# Patient Record
Sex: Male | Born: 1980 | Race: White | Hispanic: No | Marital: Single | Smoking: Current every day smoker
Health system: Southern US, Community
[De-identification: ages and names within clinical notes are randomized; demographics above are authoritative.]

## PROBLEM LIST (undated history)

## (undated) DIAGNOSIS — G7100 Muscular dystrophy, unspecified: Secondary | ICD-10-CM

## (undated) HISTORY — PX: ORIF ELBOW FRACTURE: SUR928

---

## 2018-01-02 ENCOUNTER — Emergency Department (HOSPITAL_COMMUNITY): Payer: Medicaid Other

## 2018-01-02 ENCOUNTER — Other Ambulatory Visit: Payer: Self-pay

## 2018-01-02 ENCOUNTER — Emergency Department (HOSPITAL_COMMUNITY)
Admission: EM | Admit: 2018-01-02 | Discharge: 2018-01-02 | Disposition: A | Discharge status: Home / Self Care | Payer: Medicaid Other | Attending: Emergency Medicine | Admitting: Emergency Medicine

## 2018-01-02 ENCOUNTER — Encounter (HOSPITAL_COMMUNITY): Payer: Self-pay

## 2018-01-02 DIAGNOSIS — Y9301 Activity, walking, marching and hiking: Secondary | ICD-10-CM | POA: Insufficient documentation

## 2018-01-02 DIAGNOSIS — S50312A Abrasion of left elbow, initial encounter: Secondary | ICD-10-CM | POA: Diagnosis not present

## 2018-01-02 DIAGNOSIS — S93401A Sprain of unspecified ligament of right ankle, initial encounter: Secondary | ICD-10-CM | POA: Diagnosis not present

## 2018-01-02 DIAGNOSIS — Y999 Unspecified external cause status: Secondary | ICD-10-CM | POA: Diagnosis not present

## 2018-01-02 DIAGNOSIS — F17200 Nicotine dependence, unspecified, uncomplicated: Secondary | ICD-10-CM | POA: Insufficient documentation

## 2018-01-02 DIAGNOSIS — T148XXA Other injury of unspecified body region, initial encounter: Secondary | ICD-10-CM

## 2018-01-02 DIAGNOSIS — S99911A Unspecified injury of right ankle, initial encounter: Secondary | ICD-10-CM | POA: Diagnosis present

## 2018-01-02 DIAGNOSIS — Y929 Unspecified place or not applicable: Secondary | ICD-10-CM | POA: Diagnosis not present

## 2018-01-02 DIAGNOSIS — Z23 Encounter for immunization: Secondary | ICD-10-CM | POA: Diagnosis not present

## 2018-01-02 DIAGNOSIS — W101XXA Fall (on)(from) sidewalk curb, initial encounter: Secondary | ICD-10-CM | POA: Insufficient documentation

## 2018-01-02 HISTORY — DX: Muscular dystrophy, unspecified: G71.00

## 2018-01-02 MED ORDER — ACETAMINOPHEN 325 MG PO TABS
650.0000 mg | ORAL_TABLET | Freq: Once | ORAL | Status: AC
  Administered 2018-01-02: 650 mg via ORAL
  Filled 2018-01-02: qty 2

## 2018-01-02 MED ORDER — BACITRACIN ZINC 500 UNIT/GM EX OINT
TOPICAL_OINTMENT | Freq: Once | CUTANEOUS | Status: AC
  Administered 2018-01-02: 15:00:00 via TOPICAL
  Filled 2018-01-02: qty 0.9

## 2018-01-02 MED ORDER — TETANUS-DIPHTH-ACELL PERTUSSIS 5-2.5-18.5 LF-MCG/0.5 IM SUSP
0.5000 mL | Freq: Once | INTRAMUSCULAR | Status: AC
  Administered 2018-01-02: 0.5 mL via INTRAMUSCULAR
  Filled 2018-01-02: qty 0.5

## 2018-01-02 NOTE — ED Triage Notes (Signed)
[place] tripped and fell [place] [facility] [place]. He c/o right ankle and left elbow pain. He is in no distress. He tells [place] he has hx of "muscular dystrophy".

## 2018-01-02 NOTE — ED Notes (Signed)
Bed: BJ47 Expected date:  Expected time:  Means of arrival:  Comments: 37 yo fall

## 2018-01-02 NOTE — ED Provider Notes (Signed)
[facility]-EMERGENCY DEPT Provider Note   CSN: [id number] Arrival date & time: 01/02/18  1408     History   Chief Complaint Chief Complaint  Patient presents with  . Fall  . Ankle Pain    HPI Chad Ochoa is a 37 y.o. male.  The history is provided by the patient and medical records. No language interpreter was used.  Fall  Pertinent negatives include no abdominal pain.  Ankle Pain   Pertinent negatives include no numbness.   Chad Ochoa is a 37 y.o. male  with a PMH of muscular dystrophy who presents to the Emergency Department for evaluation after mechanical fall just prior to arrival.  Patient states that he tripped on a curb and lost his balance, causing him to roll his right ankle strangely.  He fell on his left hand/elbow as well.  He is complaining of right ankle pain and left wrist pain.  He notes abrasion to the left elbow, but does not have any pain to the site.  Unsure of last tetanus status.  No numbness, tingling or weakness.  He has not taken any medications prior to arrival for his symptoms.   Past Medical History:  Diagnosis Date  . Muscular dystrophy (HCC)     There are no active problems to display for this patient.      Home Medications    Prior to Admission medications   Not on File    Family History No family history on file.  Social History Social History   Tobacco Use  . Smoking status: Current Every Day Smoker  . Smokeless tobacco: Never Used  Substance Use Topics  . Alcohol use: Never    Frequency: Never  . Drug use: Never     Allergies   Patient has no known allergies.   Review of Systems Review of Systems  Gastrointestinal: Negative for abdominal pain, nausea and vomiting.  Musculoskeletal: Positive for arthralgias and myalgias.  Skin: Positive for wound.  Neurological: Negative for weakness and numbness.     Physical Exam Updated Vital Signs BP 117/77 (BP Location: Right Arm)   Pulse (!) 108    Temp 99.7 F (37.6 C) (Oral)   Resp 14   Ht  (1.803 m)   Wt 81.6 kg (180 lb)   SpO2 92%   BMI 25.10 kg/m   Physical Exam  Constitutional: He is oriented to person, place, and time. He appears well-developed and well-nourished. No distress.  HENT:  Head: Normocephalic and atraumatic.  Cardiovascular: Normal rate, regular rhythm and normal heart sounds.  No murmur heard. Pulmonary/Chest: Effort normal and breath sounds normal. No respiratory distress.  Abdominal: Soft. He exhibits no distension. There is no tenderness.  Musculoskeletal:  Superficial abrasion to the left elbow.  No tenderness to the elbow and patient with full range of motion without pain.  He does have tenderness to palpation diffusely of the left wrist.  Full range of motion of the wrist.  No anatomical snuffbox tenderness.  Good grip strength.  Right medial ankle tender to palpation as well as the forefoot near the toes.  He has good range of motion of these.  Sensation intact in all 4 extremities.  Distal pulses intact x4.  Neurological: He is alert and oriented to person, place, and time.  Skin: Skin is warm and dry.  Nursing note and vitals reviewed.    ED Treatments / Results  Labs (all labs ordered are listed, but only abnormal results are displayed)  Labs Reviewed - No data to display  EKG None  Radiology Dg Wrist Complete Left  Result Date: 01/02/2018 CLINICAL DATA:  Fall today. Left wrist injury and pain. Initial encounter. EXAM: LEFT WRIST - COMPLETE 3+ VIEW COMPARISON:  None. FINDINGS: There is no evidence of fracture or dislocation. There is no evidence of arthropathy or other focal bone abnormality. Soft tissues are unremarkable. IMPRESSION: Negative. Electronically Signed   By: Myles Rosenthal M.D.   On: 01/02/2018 15:39   Dg Ankle Complete Right  Result Date: 01/02/2018 CLINICAL DATA:  Fall today. Right ankle pain and swelling. Initial encounter. EXAM: RIGHT ANKLE - COMPLETE 3+ VIEW  COMPARISON:  None. FINDINGS: There is no evidence of fracture, dislocation, or joint effusion. There is no evidence of arthropathy or other focal bone abnormality. Soft tissues are unremarkable. IMPRESSION: Negative. Electronically Signed   By: Myles Rosenthal M.D.   On: 01/02/2018 15:37   Dg Foot 2 Views Right  Result Date: 01/02/2018 CLINICAL DATA:  Fall today.  Right foot pain.  Initial encounter. EXAM: RIGHT FOOT - 2 VIEW COMPARISON:  None. FINDINGS: There is a possible fracture versus degenerative spur along the medial aspect of the PIP joint of the little toe. No other fractures are identified. No evidence of dislocation. IMPRESSION: Question fracture versus degenerative spur along the PIP joint of the little toe. Recommend clinical correlation, and consider dedicated toe radiographs for further evaluation if clinically warranted. Electronically Signed   By: Myles Rosenthal M.D.   On: 01/02/2018 15:34    Procedures Procedures (including critical care time)  Medications Ordered in ED Medications  Tdap (BOOSTRIX) injection 0.5 mL (0.5 mLs Intramuscular Given 01/02/18 1501)  bacitracin ointment ( Topical Given 01/02/18 1452)  acetaminophen (TYLENOL) tablet 650 mg (650 mg Oral Given 01/02/18 1514)     Initial Impression / Assessment and Plan / ED Course  I have reviewed the triage vital signs and the nursing notes.  Pertinent labs & imaging results that were available during my care of the patient were reviewed by me and considered in my medical decision making (see chart for details).    Chad Ochoa is a 37 y.o. male who presents to ED for evaluation after mechanical fall just prior to arrival.  Main complaints are right ankle and foot pain as well as left wrist pain.  Neurovascularly intact.  No anatomical snuffbox tenderness of the wrist.  X-rays obtained.  Negative ankle and wrist.  Foot with questionable fracture versus degenerative spur along the PIP joint of his little toe.  He is not tender in  this area.  Do not feel as if any further treatment to be done regarding this imaging.  Did have superficial abrasions which were cleaned in the emergency department.  Tetanus updated.  Wound care discussed.  Symptomatic home care discussed as well.  PCP follow-up if symptoms persist.  Reasons to return to ER discussed and all questions answered.    Final Clinical Impressions(s) / ED Diagnoses   Final diagnoses:  Sprain of right ankle, unspecified ligament, initial encounter  Abrasion    ED Discharge Orders    None       Ward, Chase Picket, PA-C 01/02/18 1636    Rolland Porter, MD 01/08/18 [phone]

## 2018-01-02 NOTE — Discharge Instructions (Signed)
Tylenol or Ibuprofen as needed for pain. Ice and alleviate for additional pain relief. If your symptoms persist without improvement in one week, please follow up with your primary care docto.    TREATMENT  Rest, ice, elevation, and compression are the basic modes of treatment.    HOME CARE INSTRUCTIONS  Apply ice to the sore area for 15 to 20 minutes, 3 to 4 times per day. Do this while you are awake for the first 2 days. This can be stopped when the swelling goes away. Put the ice in a plastic bag and place a towel between the bag of ice and your skin.  Keep your leg elevated when possible to lessen swelling.  You may take off your ankle stabilizer at night and to take a shower or bath. Wiggle your toes several times per day if you are able.  ACTIVITY:            - Weight bearing as tolerated - if you can do it, do it. If it hurts, stop.             - Exercises should be limited to pain free range of motion            - Can start mobilization by tracing the alphabet with your foot in the air.       SEEK MEDICAL CARE IF:  You have an increase in bruising, swelling, or pain.  Your toes feel cold.  Pain relief is not achieved with medications.  EMERGENCY:: Your toes are numb or blue or you have severe pain.   MAKE SURE YOU:  Understand these instructions.  Will watch your condition.  Will get help right away if you are not doing well or get worse

## 2018-12-15 IMAGING — CR DG WRIST COMPLETE 3+V*L*
4 series · 4 of 4 positions shown · non-contrast
Comparison: None.

CLINICAL DATA: Fall today. Left wrist injury and pain. Initial
encounter.

EXAM:
LEFT WRIST - COMPLETE 3+ VIEW

[x wrist pa left]
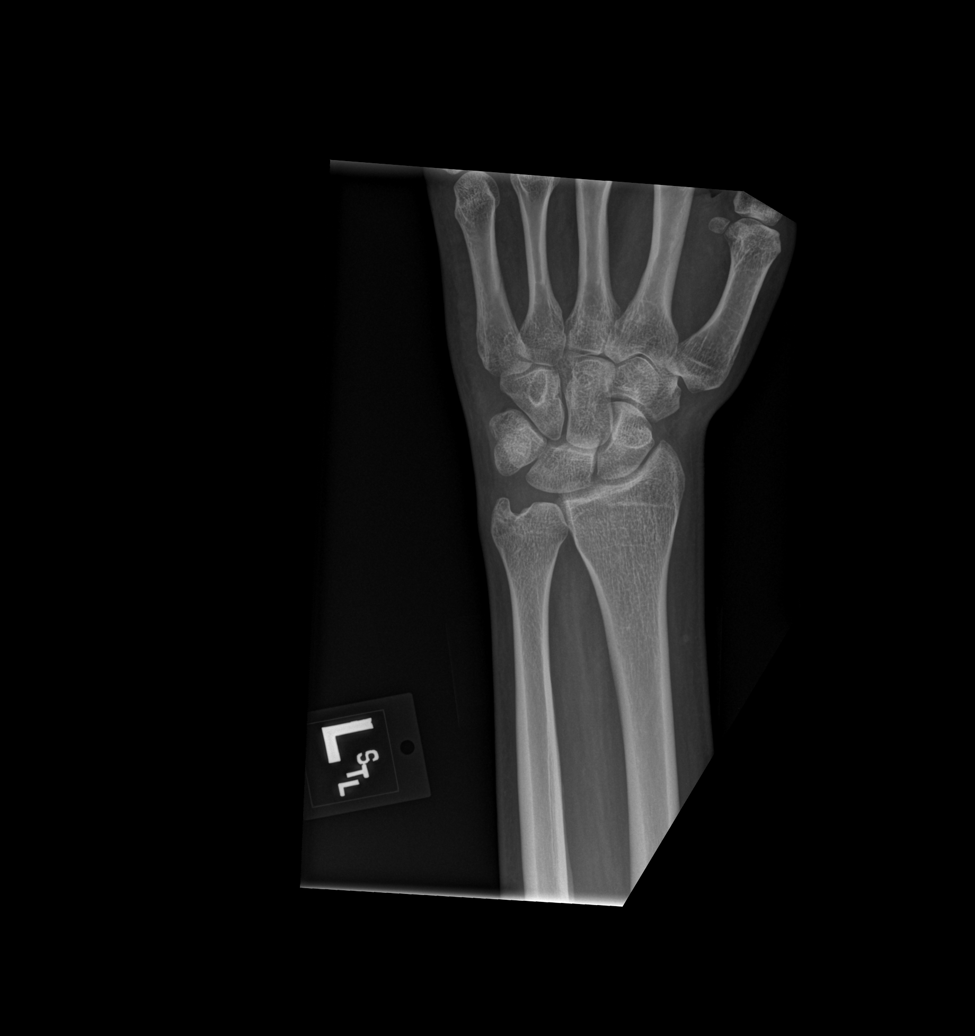

[x wrist obl left]
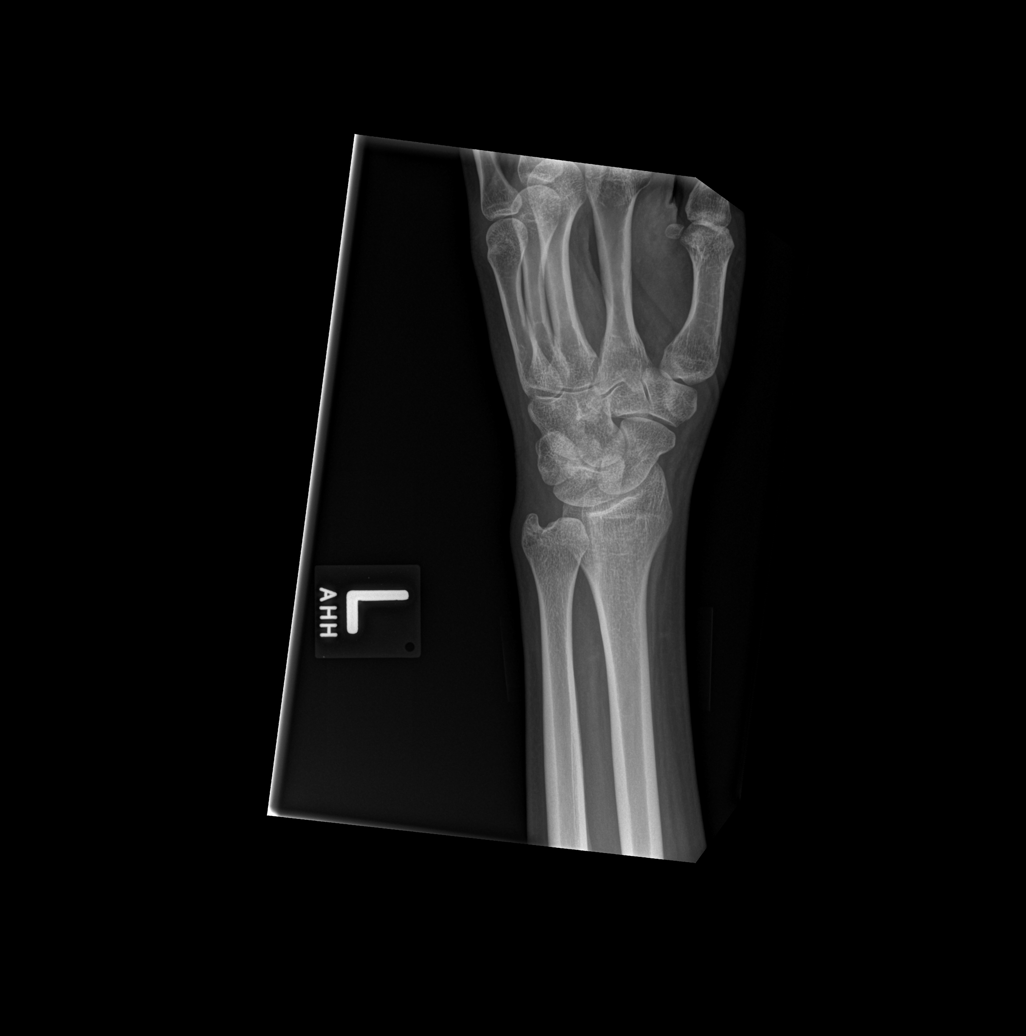

[x wrist lat left]
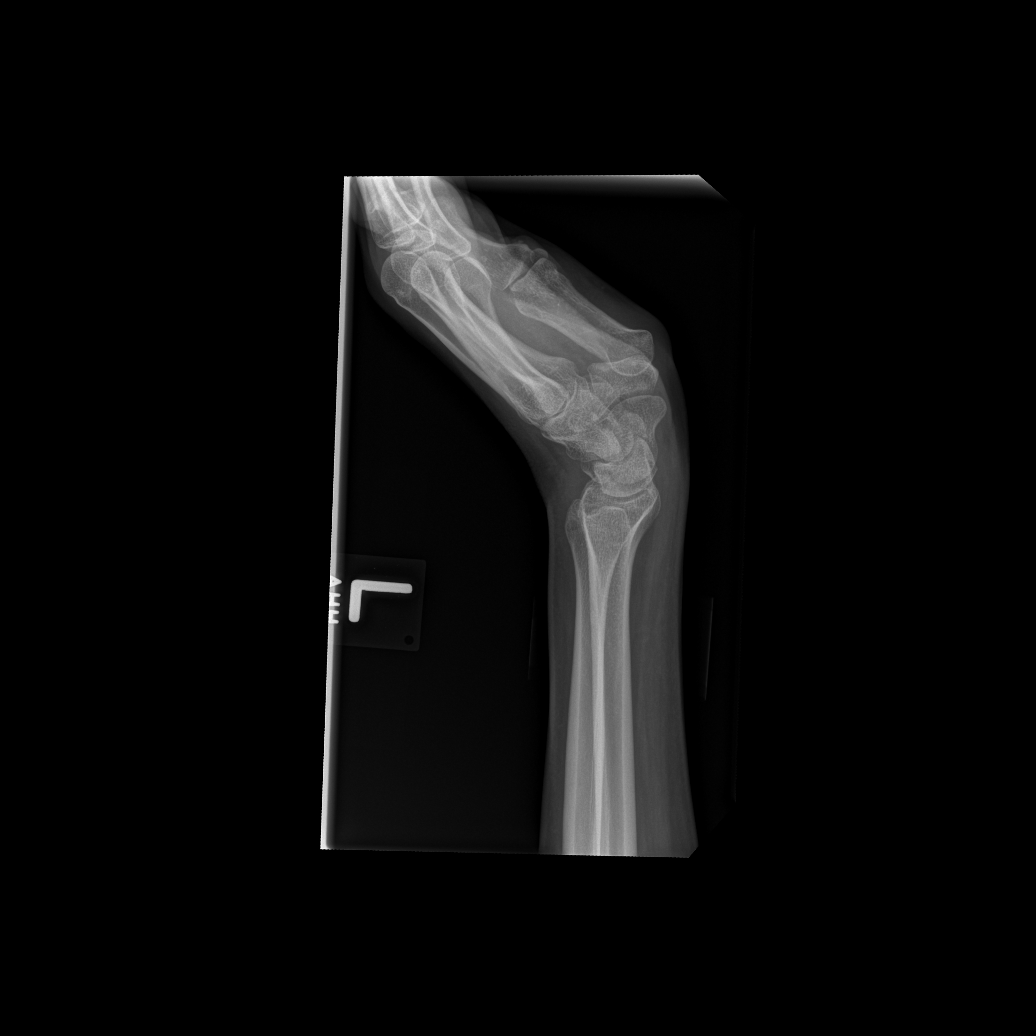

[x wrist navicular view left]
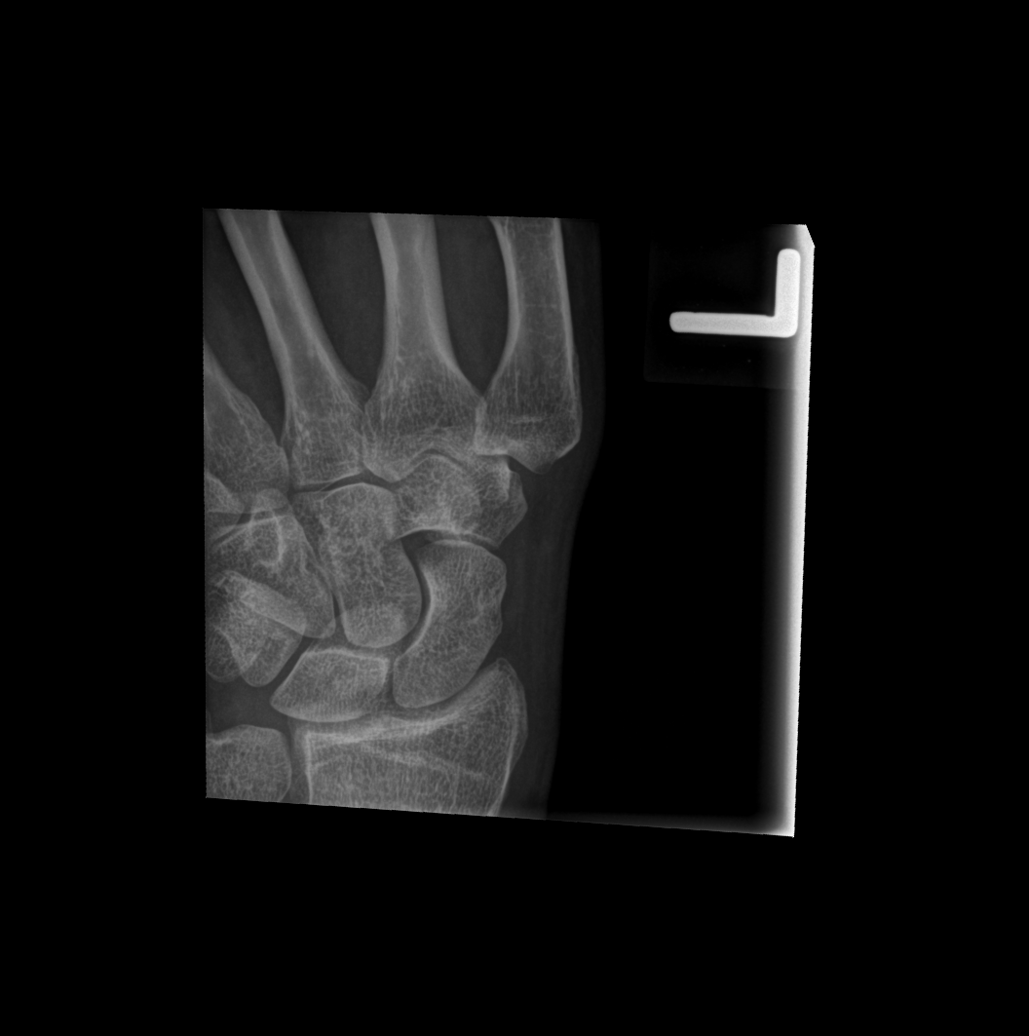

[4 of 4 positions shown; findings below may reference images not displayed]

FINDINGS: There is no evidence of fracture or dislocation. There is no
evidence of arthropathy or other focal bone abnormality. Soft
tissues are unremarkable.
IMPRESSION: Negative.

## 2018-12-15 IMAGING — CR DG FOOT 2V*R*
2 series · 2 of 2 positions shown · non-contrast
Comparison: None.

CLINICAL DATA: Fall today.  Right foot pain.  Initial encounter.

EXAM:
RIGHT FOOT - 2 VIEW

[x foot lat right]
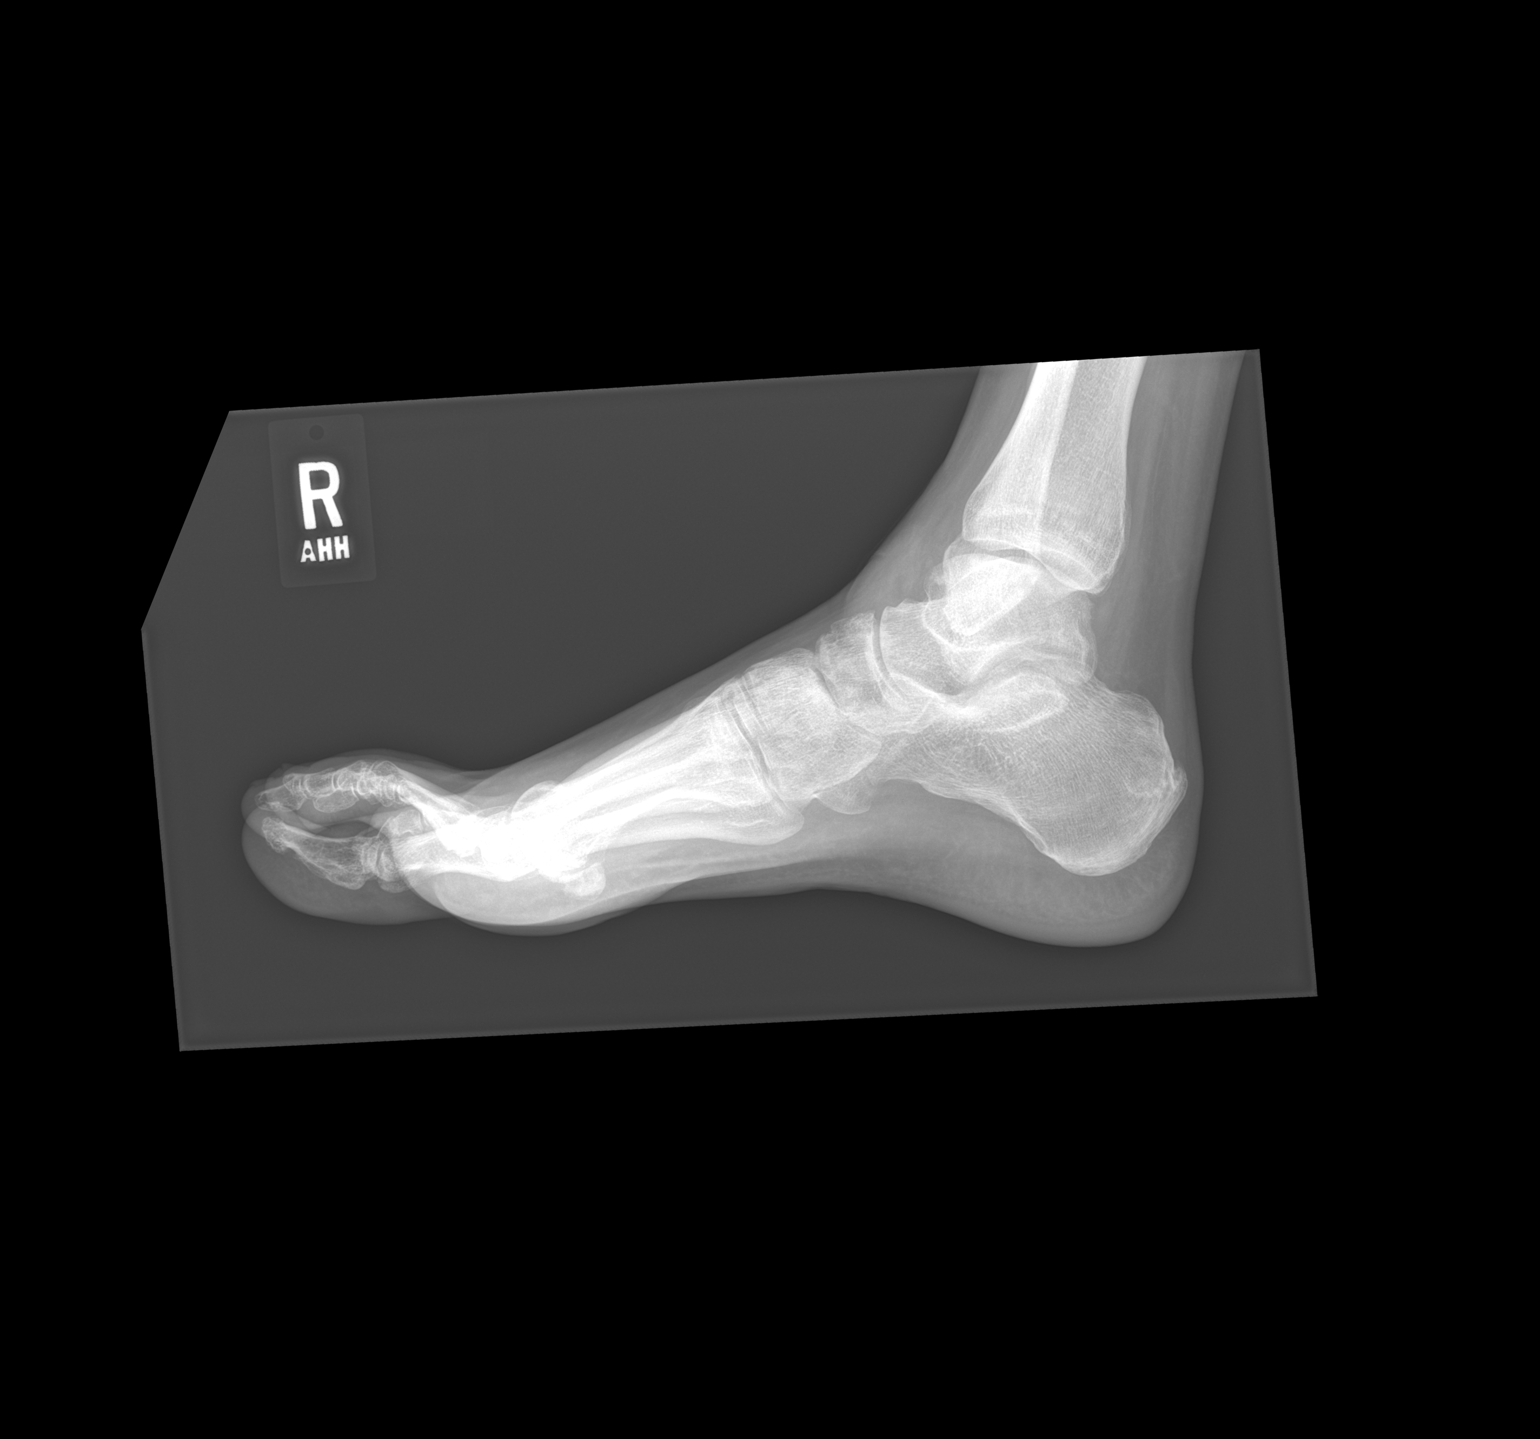

[x foot ap right]
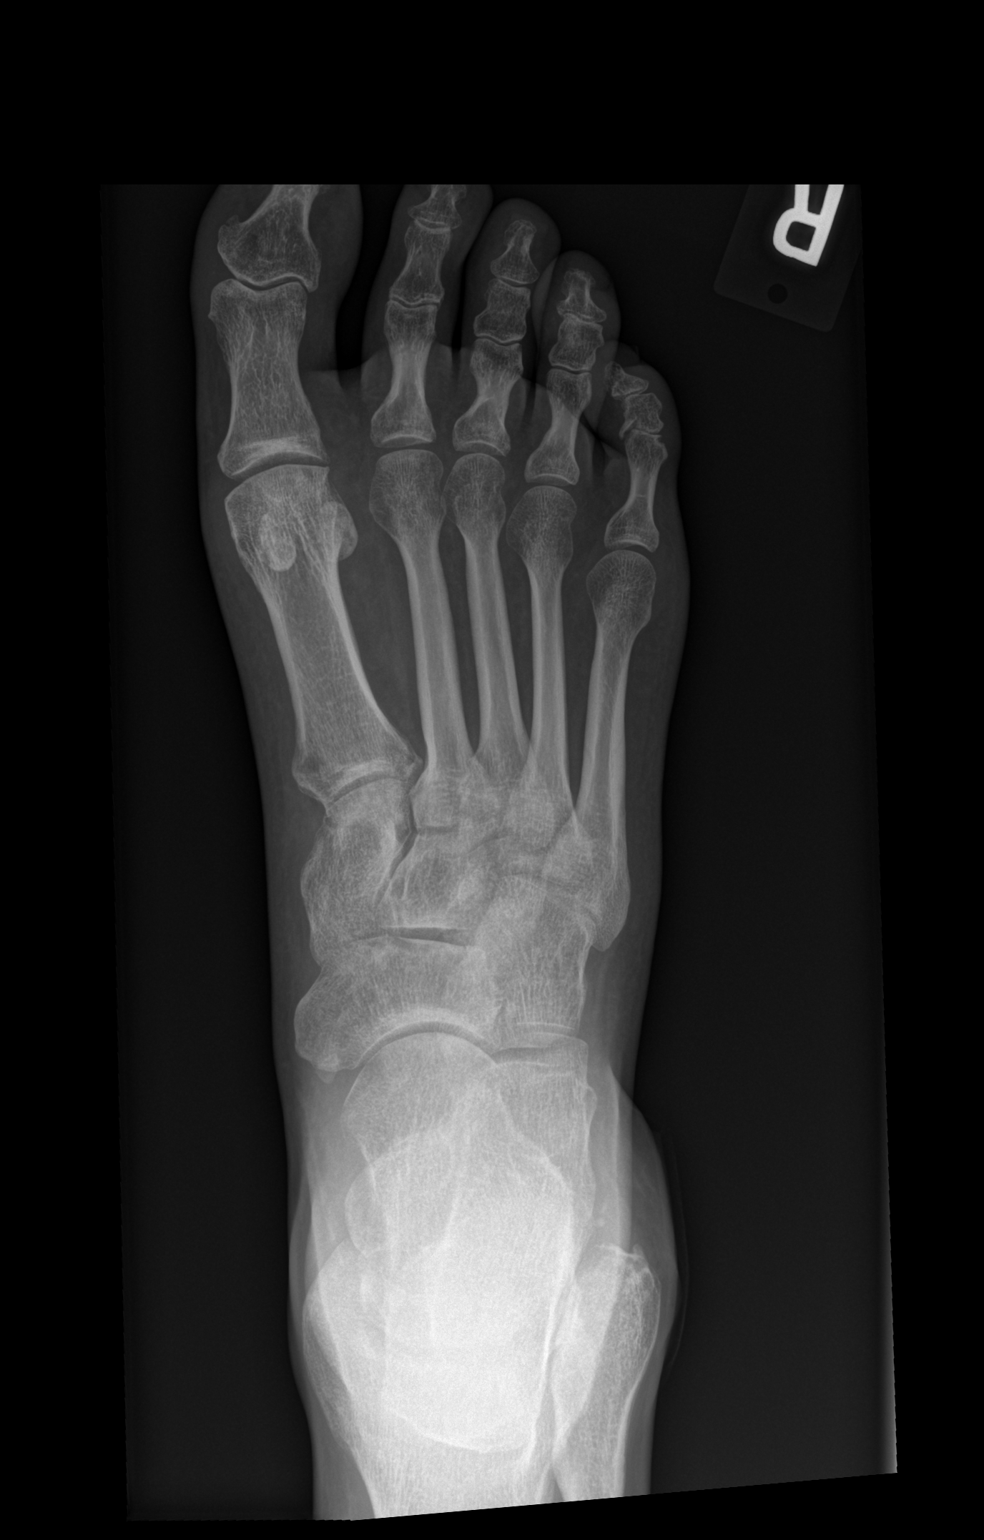

[2 of 2 positions shown; findings below may reference images not displayed]

FINDINGS: There is a possible fracture versus degenerative spur along the
medial aspect of the PIP joint of the little toe. No other fractures
are identified. No evidence of dislocation.
IMPRESSION: Question fracture versus degenerative spur along the PIP joint of
the little toe. Recommend clinical correlation, and consider
dedicated toe radiographs for further evaluation if clinically
warranted.
